# Patient Record
Sex: Female | Born: 1995 | Race: Black or African American | Hispanic: No | Marital: Single | Smoking: Never smoker
Health system: Southern US, Community
[De-identification: ages and names within clinical notes are randomized; demographics above are authoritative.]

## PROBLEM LIST (undated history)

## (undated) DIAGNOSIS — D649 Anemia, unspecified: Secondary | ICD-10-CM

---

## 2014-07-04 ENCOUNTER — Encounter (HOSPITAL_COMMUNITY): Payer: Self-pay | Admitting: Emergency Medicine

## 2014-07-04 ENCOUNTER — Ambulatory Visit: Payer: Managed Care, Other (non HMO)

## 2014-07-04 ENCOUNTER — Emergency Department (HOSPITAL_COMMUNITY): Payer: Managed Care, Other (non HMO)

## 2014-07-04 ENCOUNTER — Emergency Department (HOSPITAL_COMMUNITY)
Admission: EM | Admit: 2014-07-04 | Discharge: 2014-07-04 | Disposition: A | Discharge status: Home / Self Care | Payer: Managed Care, Other (non HMO) | Attending: Emergency Medicine | Admitting: Emergency Medicine

## 2014-07-04 DIAGNOSIS — W2105XA Struck by basketball, initial encounter: Secondary | ICD-10-CM | POA: Diagnosis not present

## 2014-07-04 DIAGNOSIS — M20021 Boutonniere deformity of right finger(s): Secondary | ICD-10-CM | POA: Diagnosis not present

## 2014-07-04 DIAGNOSIS — Y9367 Activity, basketball: Secondary | ICD-10-CM | POA: Diagnosis not present

## 2014-07-04 DIAGNOSIS — Y92310 Basketball court as the place of occurrence of the external cause: Secondary | ICD-10-CM | POA: Insufficient documentation

## 2014-07-04 DIAGNOSIS — S6991XA Unspecified injury of right wrist, hand and finger(s), initial encounter: Secondary | ICD-10-CM | POA: Diagnosis present

## 2014-07-04 MED ORDER — NAPROXEN 500 MG PO TABS: 500.0000 mg | ORAL_TABLET | Freq: Two times a day (BID) | ORAL | Status: AC

## 2014-07-04 NOTE — ED Provider Notes (Signed)
Medical screening examination/treatment/procedure(s) were performed by non-physician practitioner and as supervising physician I was immediately available for consultation/collaboration.  Aunesti Pellegrino T Carrie Schoonmaker, MD 07/04/14 2355 

## 2014-07-04 NOTE — ED Provider Notes (Signed)
CSN: [id number]     Arrival date & time 07/04/14  1745 History  This chart was scribed for a non-physician practitioner, Jaynie Crumbleatyana Hinata Diener, PA-C, working with Toy BakerAnthony T Allen, MD by Julian HyMorgan Graham, ED Scribe. The patient was seen in WTR9/WTR9. The patient's care was started at 6:25 PM.   Chief Complaint  Patient presents with  . Finger Injury    HPI HPI Comments: Laruth BouchardShatiya Engelmann is a 18 y.o. female who presents to the Emergency Department complaining of acute, moderate, gradually worsening right fifth finger injury onset two hours ago. She is experiencing associated pain in the right fifth finger. Pt has previously fractured the same finger. Pt is unable to straighten her finger at this time. Pt notes she was playing basketball when this occurred. Pt denies any other symptoms at this time.   History reviewed. No pertinent past medical history. History reviewed. No pertinent past surgical history. No family history on file. History  Substance Use Topics  . Smoking status: Never Smoker   . Smokeless tobacco: Not on file  . Alcohol Use: No   OB History   Grav Para Term Preterm Abortions TAB SAB Ect Mult Living                 Review of Systems  Constitutional: Negative for fever and chills.  Respiratory: Negative for shortness of breath.   Gastrointestinal: Negative for nausea and vomiting.  Musculoskeletal: Positive for arthralgias.  Neurological: Negative for weakness.   Allergies  Review of patient's allergies indicates no known allergies.  Home Medications   Prior to Admission medications   Not on File   Triage Vitals: BP 126/66  Pulse 76  Temp(Src) 97.5 F (36.4 C) (Oral)  Resp 22  SpO2 100%  LMP 07/02/2014  Physical Exam  Nursing note and vitals reviewed. Constitutional: She appears well-developed and well-nourished. No distress.  Eyes: Conjunctivae are normal.  Neck: Neck supple.  Musculoskeletal:  Right pinky deformity, patient is holding her finger Flexed at  PIP joint, extended at DIP joint. Unable to extend at PIP joint, unable to flex the DIP joint. Full range of motion at MCP joint. Tender to palpation at PIP joint. Cap refill less than 2 seconds distally. Normal rest of the hand. Normal wrist flexion and extension.  Neurological: She is alert.  Skin: Skin is warm and dry.    ED Course  Procedures (including critical care time) DIAGNOSTIC STUDIES: Oxygen Saturation is 100% on RA, normal by my interpretation.    COORDINATION OF CARE: 6:30 PM- Patient informed of current plan for treatment and evaluation and agrees with plan at this time.  Imaging Review Dg Finger Little Right  07/04/2014   CLINICAL DATA:  Acute right fifth finger pain after basketball injury.  EXAM: RIGHT LITTLE FINGER 2+V  COMPARISON:  None.  FINDINGS: There is no evidence of fracture or dislocation. There is no evidence of arthropathy or other focal bone abnormality. Soft tissues are unremarkable.  IMPRESSION: Normal right fifth finger.   Electronically Signed   By: Roque LiasJames  Green M.D.   On: 07/04/2014 19:23     MDM   Final diagnoses:  Central slip extensor tendon injury (boutonniere), right    patient with deformity of the right finger, loss of ability to extend PIP and flex DIP joint. This is consistent with boutonniere deformity of the finger. Suspect injury to the central slip extensor tendon of the PIP joint. Finger is splinted, Buddy tape. Will need hand followup. NSAIDs, ice, elevation.  Filed Vitals:   07/04/14 1803  BP: 126/66  Pulse: 76  Temp: 97.5 F (36.4 C)  TempSrc: Oral  Resp: 22  SpO2: 100%   I personally performed the services described in this documentation, which was scribed in my presence. The recorded information has been reviewed and is accurate.     Lottie Musselatyana A Wilver Tignor, PA-C 07/04/14 1947

## 2014-07-04 NOTE — Discharge Instructions (Signed)
Your x-ray shows no fractures, however I suspect you may have an injury to a tendon in your finger, called boutonniere deformity. Keep a splint on at all times. Ice and elevate. Naproxen for pain. Followup with hand specialist is referred, as able.

## 2014-07-04 NOTE — ED Notes (Signed)
Pt presents with c/o right finger injury. Pt reports she was playing basketball earlier today and jammed her right pinky finger. Pain to that area, finger bent at knuckle.

## 2014-11-02 ENCOUNTER — Emergency Department (HOSPITAL_COMMUNITY)
Admission: EM | Admit: 2014-11-02 | Discharge: 2014-11-02 | Disposition: A | Discharge status: Home / Self Care | Payer: Managed Care, Other (non HMO) | Attending: Emergency Medicine | Admitting: Emergency Medicine

## 2014-11-02 ENCOUNTER — Encounter (HOSPITAL_COMMUNITY): Payer: Self-pay | Admitting: Emergency Medicine

## 2014-11-02 DIAGNOSIS — R45851 Suicidal ideations: Secondary | ICD-10-CM

## 2014-11-02 DIAGNOSIS — Z791 Long term (current) use of non-steroidal anti-inflammatories (NSAID): Secondary | ICD-10-CM | POA: Diagnosis not present

## 2014-11-02 DIAGNOSIS — F329 Major depressive disorder, single episode, unspecified: Secondary | ICD-10-CM | POA: Diagnosis not present

## 2014-11-02 DIAGNOSIS — F32A Depression, unspecified: Secondary | ICD-10-CM

## 2014-11-02 DIAGNOSIS — Z862 Personal history of diseases of the blood and blood-forming organs and certain disorders involving the immune mechanism: Secondary | ICD-10-CM | POA: Insufficient documentation

## 2014-11-02 DIAGNOSIS — F4329 Adjustment disorder with other symptoms: Secondary | ICD-10-CM | POA: Diagnosis present

## 2014-11-02 HISTORY — DX: Anemia, unspecified: D64.9

## 2014-11-02 LAB — COMPREHENSIVE METABOLIC PANEL
ALBUMIN: 4.1 g/dL (ref 3.5–5.2)
ALT: 21 U/L (ref 0–35)
ANION GAP: 9 (ref 5–15)
AST: 26 U/L (ref 0–37)
Alkaline Phosphatase: 46 U/L (ref 39–117)
BUN: 13 mg/dL (ref 6–23)
CALCIUM: 9.1 mg/dL (ref 8.4–10.5)
CO2: 24 mmol/L (ref 19–32)
CREATININE: 0.8 mg/dL (ref 0.50–1.10)
Chloride: 104 mmol/L (ref 96–112)
GFR calc Af Amer: >90 mL/min (ref >90)
GFR calc non Af Amer: >90 mL/min (ref >90)
GLUCOSE: 96 mg/dL (ref 70–99)
POTASSIUM: 3.1 mmol/L — AB (ref 3.5–5.1)
Sodium: 137 mmol/L (ref 135–145)
TOTAL PROTEIN: 6.7 g/dL (ref 6.0–8.3)
Total Bilirubin: 0.7 mg/dL (ref 0.3–1.2)

## 2014-11-02 LAB — CBC WITH DIFFERENTIAL/PLATELET
BASOS ABS: 0 10*3/uL (ref 0.0–0.1)
Basophils Relative: 0 % (ref 0–1)
Eosinophils Absolute: 0 10*3/uL (ref 0.0–0.7)
Eosinophils Relative: 0 % (ref 0–5)
HCT: 35 % — ABNORMAL LOW (ref 36.0–46.0)
Hemoglobin: 11.6 g/dL — ABNORMAL LOW (ref 12.0–15.0)
Lymphocytes Relative: 37 % (ref 12–46)
Lymphs Abs: 2.5 10*3/uL (ref 0.7–4.0)
MCH: 30.8 pg (ref 26.0–34.0)
MCHC: 33.1 g/dL (ref 30.0–36.0)
MCV: 92.8 fL (ref 78.0–100.0)
MONO ABS: 0.5 10*3/uL (ref 0.1–1.0)
Monocytes Relative: 7 % (ref 3–12)
NEUTROS ABS: 3.7 10*3/uL (ref 1.7–7.7)
Neutrophils Relative %: 56 % (ref 43–77)
Platelets: 289 10*3/uL (ref 150–400)
RBC: 3.77 MIL/uL — ABNORMAL LOW (ref 3.87–5.11)
RDW: 13.5 % (ref 11.5–15.5)
WBC: 6.7 10*3/uL (ref 4.0–10.5)

## 2014-11-02 LAB — ETHANOL: Alcohol, Ethyl (B): <5 mg/dL (ref 0–9)

## 2014-11-02 NOTE — ED Provider Notes (Signed)
I was approached by a psychiatrist, Dr. Louretta Shorten. He has met the patient and will request that she be discharged with the resources he is provided for her.  Tanna Furry, MD 11/02/14 1055

## 2014-11-02 NOTE — ED Notes (Signed)
PT HAS SIGNED NO HARM CONTRACT AND COPY SENT HOME WITH PT.;  FAXED COPY TO BH.;  BELONGINGS RETURNED TO PT AND SHE HAS SIGNED RECIPT.

## 2014-11-02 NOTE — Progress Notes (Signed)
Spoke with Pt's mom, Asher MuirJamie [phone], to inform her of Pt's scheduled [facility] March 1st at 8:45am.  Asher MuirJamie asked if that was the earliest available and CSW reported that it was, however offered the phone number to the outpatient clinic if Asher MuirJamie had other questions. CSW offered to provide names of other outpatient therapists, but Asher MuirJamie reported that she may seek something out through her work.    Chad CordialLauren Carter, LCSWA 11/02/2014 2:10 PM

## 2014-11-02 NOTE — ED Notes (Signed)
Pt. wanded by security at triage .  

## 2014-11-02 NOTE — Progress Notes (Signed)
CS[facility]ic[facility] regarding referral. Waiting on call back.   Chad CordialLauren Carter, LCSWA 11/02/2014 12:08 PM

## 2014-11-02 NOTE — ED Notes (Addendum)
Call Belva ChimesMom Jamie [phone] for any updates

## 2014-11-02 NOTE — ED Notes (Signed)
Pt. reports suicidal ideation , pt. did not disclose plan of suicide , denies. hallucinations .

## 2014-11-02 NOTE — ED Notes (Signed)
DR Carmelina DaneJONNALAGADA AT BEDSIDE TALKING TO PATIENT
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
## 2014-11-02 NOTE — Discharge Instructions (Signed)
Follow-up with the outpatient resources provided to you by the behavioral health hospital team, and psychiatrist.  Return to the ER with any worsening symptoms or worsening thoughts of suicide.

## 2014-11-02 NOTE — ED Notes (Signed)
Lauren from [facility]h will call pt mom and work with her to set up iop appointment
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
## 2014-11-02 NOTE — ED Notes (Signed)
Staffing office notified for pt.'s sitter , purple paper scrubs given to pt.

## 2014-11-02 NOTE — ED Provider Notes (Signed)
CSN: [id number]     Arrival date & time 11/02/14  0003 History  This chart was scribed for No att. providers found by Richarda Overlieichard Holland, ED Scribe. This patient was seen in room C20C/C20C and the patient's care was started 3:46 AM.    Chief Complaint  Patient presents with  . Suicidal   HPI HPI Comments: Alexandria Martin is a 19 y.o. female who presents to the Emergency Department complaining of suicidal ideations that started last night. Pt states that she has not thought about a specific plan. She denies any alcohol or drug use tonight. Pt reports she has no new stressors in her life but her mother reports that pt recently broke up with her girlfriend. Pt reports that she takes no medications regularly. She denies rhinorrhea, cough, fever and HI. Pt complains of no medical symptoms at this time.   Past Medical History  Diagnosis Date  . Anemia    History reviewed. No pertinent past surgical history. No family history on file. History  Substance Use Topics  . Smoking status: Never Smoker   . Smokeless tobacco: Not on file  . Alcohol Use: No   OB History    No data available     Review of Systems  Psychiatric/Behavioral: Positive for suicidal ideas.  All other systems reviewed and are negative.   Allergies  Review of patient's allergies indicates no known allergies.  Home Medications   Prior to Admission medications   Medication Sig Start Date End Date Taking? Authorizing Provider  naproxen (NAPROSYN) 500 MG tablet Take 1 tablet (500 mg total) by mouth 2 (two) times daily. 07/04/14   Tatyana A Kirichenko, PA-C   BP 127/79 mmHg  Pulse 86  Temp(Src) 98.2 F (36.8 C) (Oral)  Resp 18  Ht 5\' 4"  (1.626 m)  Wt 122 lb (55.339 kg)  BMI 20.93 kg/m2  SpO2 100%  LMP 10/29/2014 Physical Exam  Constitutional: She is oriented to person, place, and time. She appears well-developed and well-nourished. No distress.  HENT:  Head: Normocephalic and atraumatic.  Nose: Nose normal.   Mouth/Throat: Oropharynx is clear and moist. No oropharyngeal exudate.  Eyes: Conjunctivae and EOM are normal. Pupils are equal, round, and reactive to light. No scleral icterus.  Neck: Normal range of motion. Neck supple. No JVD present. No tracheal deviation present. No thyromegaly present.  Cardiovascular: Normal rate, regular rhythm and normal heart sounds.  Exam reveals no gallop and no friction rub.   No murmur heard. Pulmonary/Chest: Effort normal and breath sounds normal. No respiratory distress. She has no wheezes. She exhibits no tenderness.  Abdominal: Soft. Bowel sounds are normal. She exhibits no distension and no mass. There is no tenderness. There is no rebound and no guarding.  Musculoskeletal: Normal range of motion. She exhibits no edema or tenderness.  Lymphadenopathy:    She has no cervical adenopathy.  Neurological: She is alert and oriented to person, place, and time. No cranial nerve deficit. She exhibits normal muscle tone.  Skin: Skin is warm and dry. No rash noted. No erythema. No pallor.  Psychiatric:  Depressed mood  Nursing note and vitals reviewed.   ED Course  Procedures   DIAGNOSTIC STUDIES: Oxygen Saturation is 100% on RA, normal by my interpretation.    COORDINATION OF CARE: 3:48 AM Discussed treatment plan with pt at bedside and pt agreed to plan.   Labs Review Labs Reviewed  CBC WITH DIFFERENTIAL/PLATELET - Abnormal; Notable for the following:    RBC 3.77 (*)  Hemoglobin 11.6 (*)    HCT 35.0 (*)    All other components within normal limits  COMPREHENSIVE METABOLIC PANEL - Abnormal; Notable for the following:    Potassium 3.1 (*)    All other components within normal limits  ETHANOL  URINE RAPID DRUG SCREEN (HOSP PERFORMED)    Imaging Review No results found.   EKG Interpretation None      MDM   Final diagnoses:  None  Patient presents to the ED for SI.  No medical complaints.  Will obtain TTS consult for disposition.  I  personally performed the services described in this documentation, which was scribed in my presence. The recorded information has been reviewed and is accurate.   Tomasita Crumble, MD 11/02/14 [phone]

## 2014-11-02 NOTE — ED Notes (Signed)
TTS in progress 

## 2014-11-02 NOTE — [facility] Counselor (Signed)
TTS Counselor spoke with pt's RN in order to gain any collateral info. Counselor asked if tele-assessment cart could be placed in pt's room. Counselor also reviewed pt's chart and MD note [facility].  Assessment to begin shortly.   Cyndie MullAnna Leam Madero, [facility] Triage Specialist

## 2015-07-04 IMAGING — CR DG FINGER LITTLE 2+V*R*
4 series · 4 of 4 positions shown · non-contrast
Comparison: None.

CLINICAL DATA: Acute right fifth finger pain after basketball
injury.

EXAM:
RIGHT LITTLE FINGER 2+V

[x finger pa right]
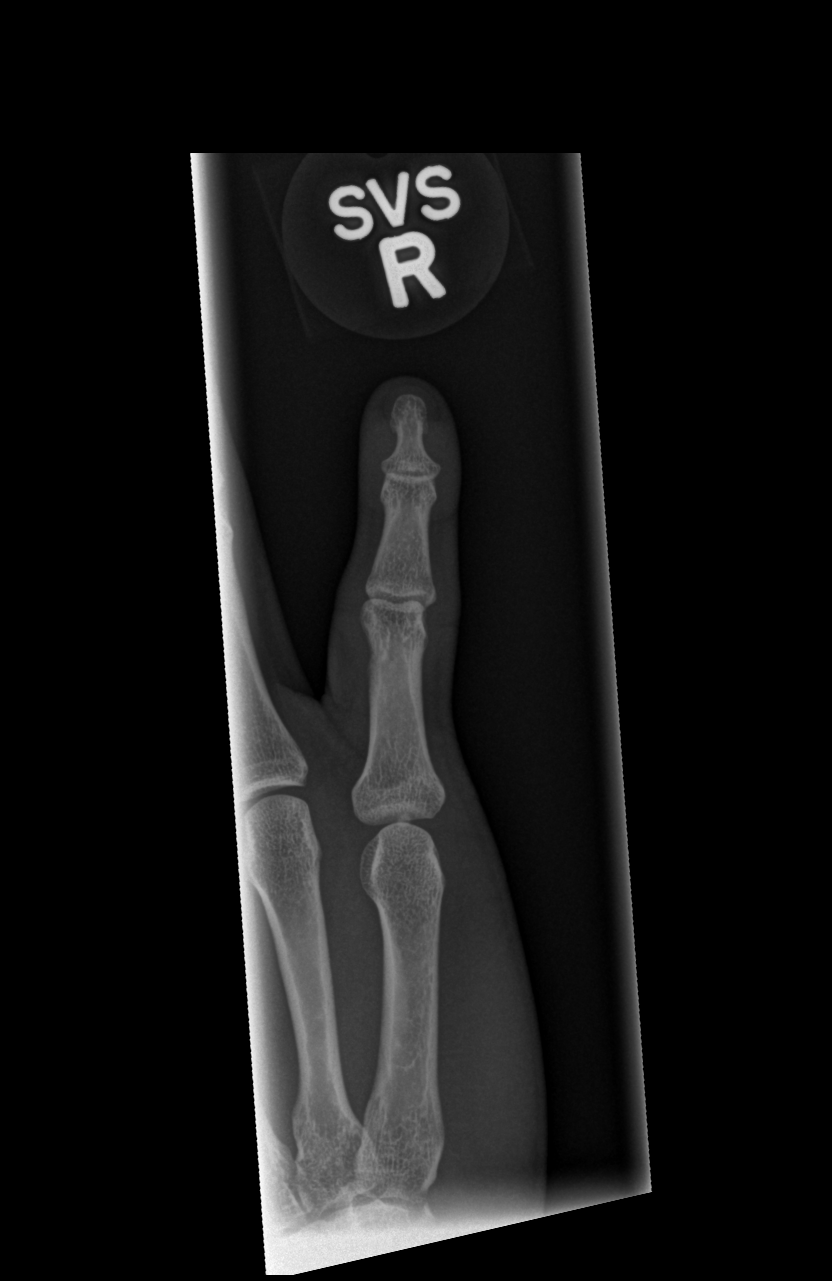

[x finger obl right]
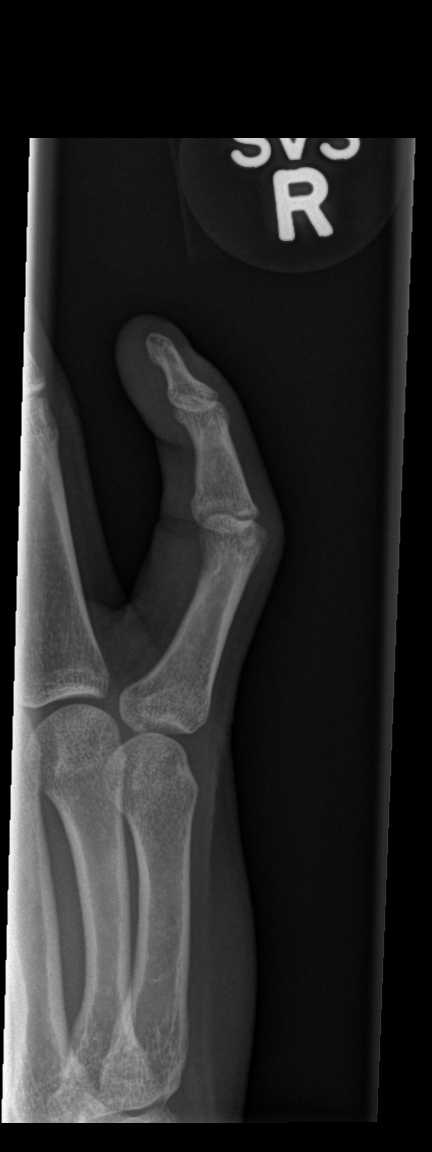

[x finger lat right (1 of 2)]
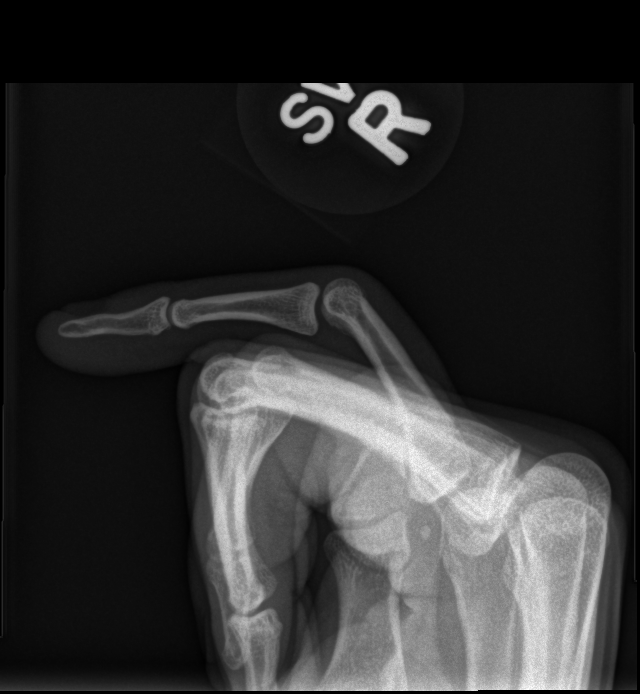

[x finger lat right (2 of 2)]
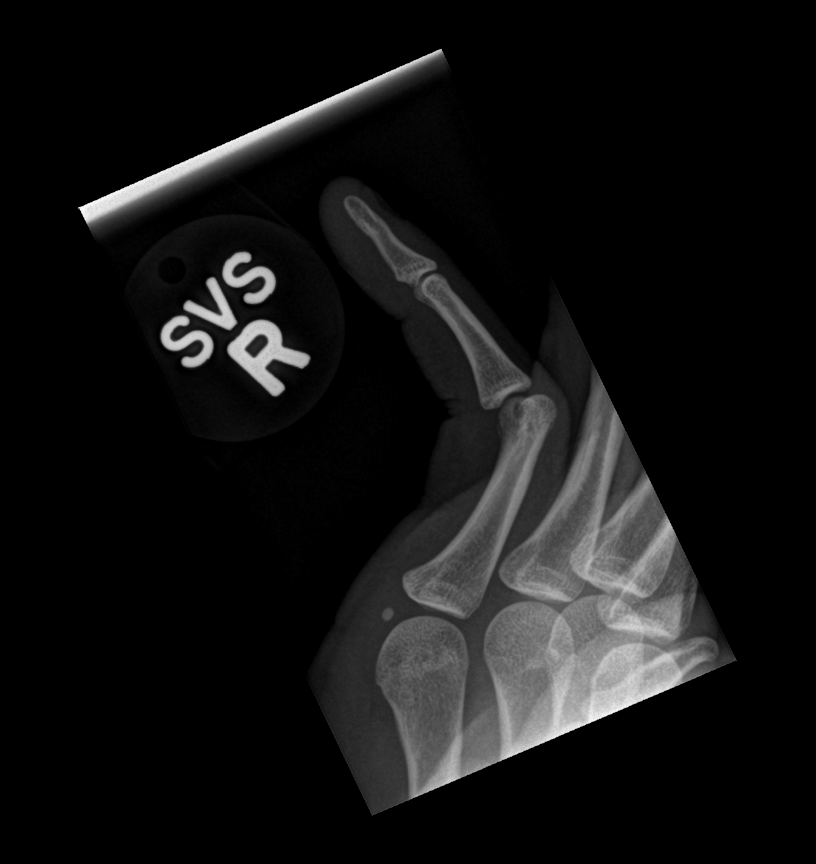

[4 of 4 positions shown; findings below may reference images not displayed]

FINDINGS: There is no evidence of fracture or dislocation. There is no
evidence of arthropathy or other focal bone abnormality. Soft
tissues are unremarkable.
IMPRESSION: Normal right fifth finger.
